# Patient Record
Sex: Male | Born: 1985 | Race: White | Hispanic: No | Marital: Single | Smoking: Current every day smoker
Health system: Southern US, Community
[De-identification: ages and names within clinical notes are randomized; demographics above are authoritative.]

## PROBLEM LIST (undated history)

## (undated) DIAGNOSIS — N200 Calculus of kidney: Secondary | ICD-10-CM

## (undated) HISTORY — PX: HAND SURGERY: SHX662

## (undated) HISTORY — PX: TONSILLECTOMY: SUR1361

---
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
## 2009-02-20 ENCOUNTER — Emergency Department (HOSPITAL_COMMUNITY): Admission: EM | Admit: 2009-02-20 | Discharge: 2009-02-20 | Payer: Self-pay | Admitting: Emergency Medicine

## 2009-12-19 ENCOUNTER — Ambulatory Visit: Payer: Self-pay | Admitting: Diagnostic Radiology

## 2009-12-19 ENCOUNTER — Emergency Department (HOSPITAL_BASED_OUTPATIENT_CLINIC_OR_DEPARTMENT_OTHER): Admission: EM | Admit: 2009-12-19 | Discharge: 2009-12-19 | Payer: Self-pay | Admitting: Emergency Medicine

## 2010-05-30 ENCOUNTER — Emergency Department (INDEPENDENT_AMBULATORY_CARE_PROVIDER_SITE_OTHER): Payer: 59

## 2010-05-30 ENCOUNTER — Emergency Department (HOSPITAL_BASED_OUTPATIENT_CLINIC_OR_DEPARTMENT_OTHER)
Admission: EM | Admit: 2010-05-30 | Discharge: 2010-05-30 | Disposition: A | Discharge status: Home / Self Care | Payer: 59 | Attending: Emergency Medicine | Admitting: Emergency Medicine

## 2010-05-30 DIAGNOSIS — R109 Unspecified abdominal pain: Secondary | ICD-10-CM | POA: Insufficient documentation

## 2010-05-30 LAB — URINALYSIS, ROUTINE W REFLEX MICROSCOPIC
Hgb urine dipstick: NEGATIVE
Ketones, ur: NEGATIVE mg/dL
Protein, ur: NEGATIVE mg/dL
Urine Glucose, Fasting: NEGATIVE mg/dL
pH: 7 (ref 5.0–8.0)

## 2010-06-16 LAB — URINALYSIS, ROUTINE W REFLEX MICROSCOPIC
Bilirubin Urine: NEGATIVE
Glucose, UA: NEGATIVE mg/dL
Nitrite: NEGATIVE
Specific Gravity, Urine: 1.014 (ref 1.005–1.030)
pH: 7.5 (ref 5.0–8.0)

## 2010-06-21 ENCOUNTER — Emergency Department (HOSPITAL_BASED_OUTPATIENT_CLINIC_OR_DEPARTMENT_OTHER)
Admission: EM | Admit: 2010-06-21 | Discharge: 2010-06-21 | Disposition: A | Discharge status: Home / Self Care | Payer: 59 | Attending: Emergency Medicine | Admitting: Emergency Medicine

## 2010-06-21 DIAGNOSIS — R197 Diarrhea, unspecified: Secondary | ICD-10-CM | POA: Insufficient documentation

## 2010-06-21 DIAGNOSIS — R109 Unspecified abdominal pain: Secondary | ICD-10-CM | POA: Insufficient documentation

## 2010-06-21 LAB — COMPREHENSIVE METABOLIC PANEL
ALT: 19 U/L (ref 0–53)
AST: 25 U/L (ref 0–37)
Alkaline Phosphatase: 87 U/L (ref 39–117)
CO2: 26 mEq/L (ref 19–32)
Chloride: 105 mEq/L (ref 96–112)
GFR calc Af Amer: >60 mL/min (ref >60)
GFR calc non Af Amer: >60 mL/min (ref >60)
Potassium: 3.8 mEq/L (ref 3.5–5.1)
Sodium: 145 mEq/L (ref 135–145)
Total Bilirubin: 0.6 mg/dL (ref 0.3–1.2)

## 2010-06-21 LAB — CBC
HCT: 42.5 % (ref 39.0–52.0)
Hemoglobin: 14.7 g/dL (ref 13.0–17.0)
RBC: 4.88 MIL/uL (ref 4.22–5.81)

## 2010-06-21 LAB — URINALYSIS, ROUTINE W REFLEX MICROSCOPIC
Hgb urine dipstick: NEGATIVE
Nitrite: NEGATIVE
Specific Gravity, Urine: 1.021 (ref 1.005–1.030)
Urobilinogen, UA: 0.2 mg/dL (ref 0.0–1.0)
pH: 7 (ref 5.0–8.0)

## 2010-06-21 LAB — DIFFERENTIAL
Basophils Absolute: 0 10*3/uL (ref 0.0–0.1)
Basophils Relative: 0 % (ref 0–1)
Lymphocytes Relative: 16 % (ref 12–46)
Neutro Abs: 6.8 10*3/uL (ref 1.7–7.7)
Neutrophils Relative %: 72 % (ref 43–77)

## 2010-07-06 LAB — URINALYSIS, ROUTINE W REFLEX MICROSCOPIC
Bilirubin Urine: NEGATIVE
Ketones, ur: NEGATIVE mg/dL
Specific Gravity, Urine: 1.019 (ref 1.005–1.030)
Urobilinogen, UA: 0.2 mg/dL (ref 0.0–1.0)

## 2010-07-06 LAB — URINE MICROSCOPIC-ADD ON

## 2010-10-03 ENCOUNTER — Emergency Department (HOSPITAL_COMMUNITY)
Admission: EM | Admit: 2010-10-03 | Discharge: 2010-10-03 | Disposition: A | Discharge status: Home / Self Care | Payer: 59 | Attending: Emergency Medicine | Admitting: Emergency Medicine

## 2010-10-03 DIAGNOSIS — R110 Nausea: Secondary | ICD-10-CM | POA: Insufficient documentation

## 2010-10-03 DIAGNOSIS — R109 Unspecified abdominal pain: Secondary | ICD-10-CM | POA: Insufficient documentation

## 2010-10-03 LAB — URINALYSIS, ROUTINE W REFLEX MICROSCOPIC
Bilirubin Urine: NEGATIVE
Leukocytes, UA: NEGATIVE
Nitrite: NEGATIVE
Specific Gravity, Urine: 1.018 (ref 1.005–1.030)
pH: 7.5 (ref 5.0–8.0)

## 2012-02-19 IMAGING — CR DG ABDOMEN 1V
1 series · 1 of 1 positions shown · non-contrast
Comparison: C t abdomen 12/19/2009.

CLINICAL DATA: Flank pain.

ABDOMEN - 1 VIEW

[t abdomen supine]
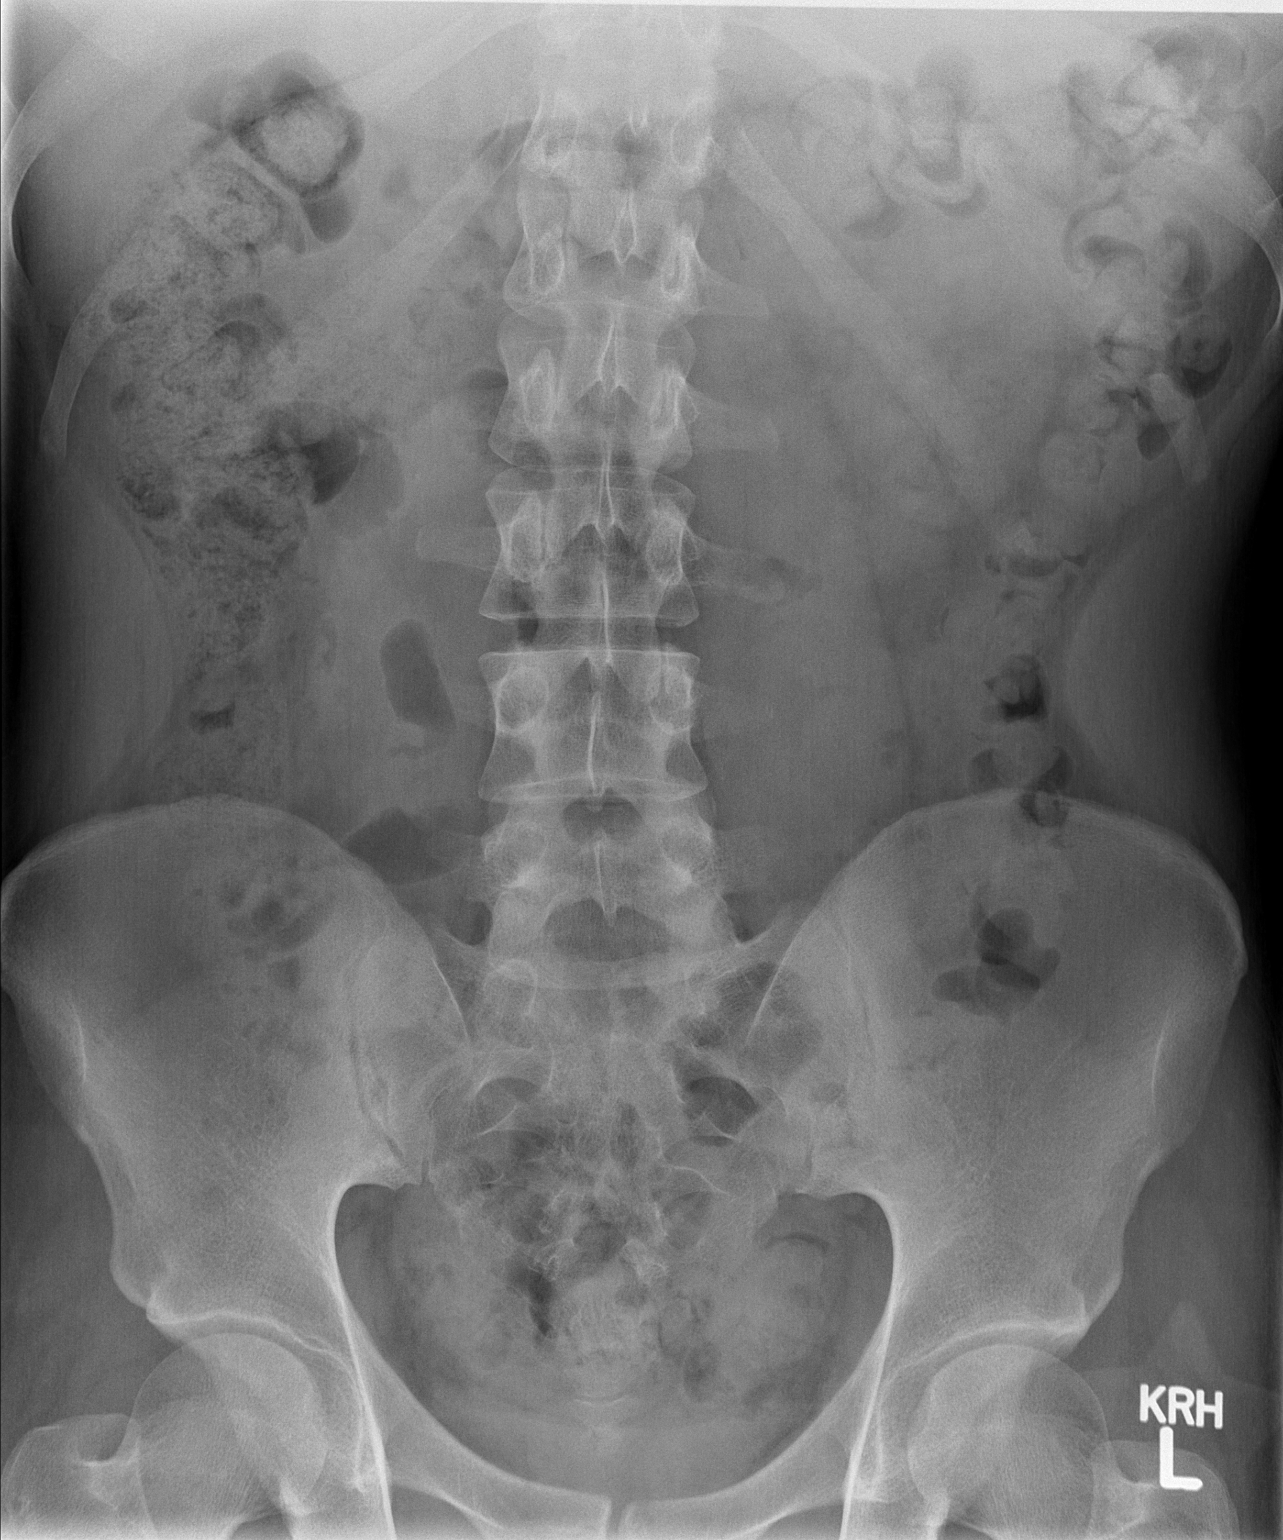

[1 of 1 positions shown; findings below may reference images not displayed]

FINDINGS: There is moderate stool throughout the colon down into
the rectum.  No distended small bowel loops to suggest obstruction.
No definite renal or ureteral calculi.  The bony structures are
unremarkable.
IMPRESSION: 1.  Moderate stool throughout the colon.
2.  No definite renal or ureteral calculi.

## 2020-09-08 ENCOUNTER — Other Ambulatory Visit: Payer: Self-pay

## 2020-09-08 ENCOUNTER — Emergency Department (HOSPITAL_BASED_OUTPATIENT_CLINIC_OR_DEPARTMENT_OTHER)
Admission: EM | Admit: 2020-09-08 | Discharge: 2020-09-08 | Disposition: A | Discharge status: Home / Self Care | Payer: Self-pay | Attending: Emergency Medicine | Admitting: Emergency Medicine

## 2020-09-08 ENCOUNTER — Encounter (HOSPITAL_BASED_OUTPATIENT_CLINIC_OR_DEPARTMENT_OTHER): Payer: Self-pay

## 2020-09-08 DIAGNOSIS — R109 Unspecified abdominal pain: Secondary | ICD-10-CM | POA: Insufficient documentation

## 2020-09-08 DIAGNOSIS — F17290 Nicotine dependence, other tobacco product, uncomplicated: Secondary | ICD-10-CM | POA: Insufficient documentation

## 2020-09-08 DIAGNOSIS — R10A2 Flank pain, left side: Secondary | ICD-10-CM

## 2020-09-08 HISTORY — DX: Calculus of kidney: N20.0

## 2020-09-08 LAB — URINALYSIS, ROUTINE W REFLEX MICROSCOPIC
Bilirubin Urine: NEGATIVE
Glucose, UA: NEGATIVE mg/dL
Hgb urine dipstick: NEGATIVE
Ketones, ur: NEGATIVE mg/dL
Leukocytes,Ua: NEGATIVE
Nitrite: NEGATIVE
Protein, ur: NEGATIVE mg/dL
Specific Gravity, Urine: 1.02 (ref 1.005–1.030)
pH: 6.5 (ref 5.0–8.0)

## 2020-09-08 MED ORDER — KETOROLAC TROMETHAMINE 60 MG/2ML IM SOLN
15.0000 mg | Freq: Once | INTRAMUSCULAR | Status: AC
  Administered 2020-09-08: 15 mg via INTRAMUSCULAR
  Filled 2020-09-08: qty 2

## 2020-09-08 NOTE — ED Provider Notes (Signed)
[facility] EMERGENCY DEPARTMENT Provider Note   CSN: [id number] Arrival date & time: 09/08/20  1650     History Chief Complaint  Patient presents with  . Flank Pain    Jeremy Whitaker is a 35 y.o. male.  35 yo M with a chief complaints of left flank pain that radiates to the testicle.  Has been going on for 1 day.  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  Started having the pain then today.  Worse with movement twisting ambulation.  He has had kidney stones in the past and does not think this feels similar.  Denies dysuria gross frequency or hesitancy.  He was concerned because of the testicular pain, does not normally get that with his kidney stones and so came here to be evaluated.  Pain is worse on the left than the right.  No bulges or masses.  The history is provided by the patient.  Flank Pain This is a new problem. The current episode started 6 to 12 hours ago. The problem occurs constantly. The problem has not changed since onset.Pertinent negatives include no chest pain, no abdominal pain, no headaches and no shortness of breath. The symptoms are aggravated by twisting, walking and bending. He has tried nothing for the symptoms. The treatment provided no relief.       Past Medical History:  Diagnosis Date  . Kidney stone     There are no problems to display for this patient.   Past Surgical History:  Procedure Laterality Date  . HAND SURGERY    . TONSILLECTOMY         No family history on file.  Social History   Tobacco Use  . Smoking status: Current Every Day Smoker    Types: E-cigarettes  . Smokeless tobacco: Never Used  Vaping Use  . Vaping Use: Every day  Substance Use Topics  . Alcohol use: Yes    Comment: occ  . Drug use: Never    Home Medications Prior to Admission medications   Not on File    Allergies    Codeine and Penicillins  Review of Systems   Review of Systems  Constitutional:  Negative for chills and fever.  HENT: Negative for congestion and facial swelling.   Eyes: Negative for discharge and visual disturbance.  Respiratory: Negative for shortness of breath.   Cardiovascular: Negative for chest pain and palpitations.  Gastrointestinal: Negative for abdominal pain, diarrhea and vomiting.  Genitourinary: Positive for flank pain and testicular pain.  Musculoskeletal: Negative for arthralgias and myalgias.  Skin: Negative for color change and rash.  Neurological: Negative for tremors, syncope and headaches.  Psychiatric/Behavioral: Negative for confusion and dysphoric mood.    Physical Exam Updated Vital Signs BP 135/90 (BP Location: Left Arm)   Pulse 68   Temp 99 F (37.2 C) (Oral)   Resp 16   Ht 5\' 11"  (1.803 m)   Wt 98 kg   SpO2 100%   BMI 30.13 kg/m   Physical Exam Vitals and nursing note reviewed.  Constitutional:      Appearance: He is well-developed.  HENT:     Head: Normocephalic and atraumatic.  Eyes:     Pupils: Pupils are equal, round, and reactive to light.  Neck:     Vascular: No JVD.  Cardiovascular:     Rate and Rhythm: Normal rate and regular rhythm.     Heart sounds: No murmur heard. No friction rub. No  gallop.   Pulmonary:     Effort: No respiratory distress.     Breath sounds: No wheezing.  Abdominal:     General: There is no distension.     Tenderness: There is no abdominal tenderness. There is no guarding or rebound.     Hernia: No hernia is present.  Genitourinary:    Comments: Circumcised, cremasteric reflex intact bilaterally.  No obvious testicular pain on exam.  No mass in the inguinal canal. Musculoskeletal:        General: Normal range of motion.     Cervical back: Normal range of motion and neck supple.  Skin:    Coloration: Skin is not pale.     Findings: No rash.  Neurological:     Mental Status: He is alert and oriented to person, place, and time.  Psychiatric:        Behavior: Behavior normal.      ED Results / Procedures / Treatments   Labs (all labs ordered are listed, but only abnormal results are displayed) Labs Reviewed  URINALYSIS, ROUTINE W REFLEX MICROSCOPIC    EKG None  Radiology No results found.  Procedures Procedures   Medications Ordered in ED Medications  ketorolac (TORADOL) injection 15 mg (15 mg Intramuscular Given 09/08/20 1716)    ED Course  I have reviewed the triage vital signs and the nursing notes.  Pertinent labs & imaging results that were available during my care of the patient were reviewed by me and considered in my medical decision making (see chart for details).    MDM Rules/Calculators/A&P                          35 yo M with a chief complaints of left sided back pain that radiates down into the groin.  Going on just today.  Occurred after he lifted something heavy yesterday.  Worse with different positions.  Patient was concerned because he had pain that radiated into the testicle.  He has no testicular pain on exam.  Cremasteric reflex is intact.  There is no masses in the canal.  I suspect the patient likely has a muscular strain.  We will test his UA as he has had multiple kidney stones in the past.  If positive for hematuria we will have him follow-up with urology.   UA without infection or hematuria.  Will treat as a muscular strain.  PCP follow-up.  5:28 PM:  I have discussed the diagnosis/risks/treatment options with the patient and believe the pt to be eligible for discharge home to follow-up with PCP. We also discussed returning to the ED immediately if new or worsening sx occur. We discussed the sx which are most concerning (e.g., sudden worsening pain, fever, inability to tolerate by mouth) that necessitate immediate return. Medications administered to the patient during their visit and any new prescriptions provided to the patient are listed below.  Medications given during this visit Medications  ketorolac (TORADOL)  injection 15 mg (15 mg Intramuscular Given 09/08/20 1716)     The patient appears reasonably screen and/or stabilized for discharge and I doubt any other medical condition or other [facility] requiring further screening, evaluation, or treatment in the ED at this time prior to discharge.    Final Clinical Impression(s) / ED Diagnoses Final diagnoses:  Left flank pain    Rx / DC Orders ED Discharge Orders    None       Melene Plan, DO 09/08/20  1728  

## 2020-09-08 NOTE — Discharge Instructions (Signed)
Your urine did not have any blood in it.  Based on the history the most likely diagnosis is a back strain. Please return for worsening testicular pain, pain to the testicle with touch, nausea, vomiting, sudden worsening pain.   Your back pain is most likely due to a muscular strain.  There is been a lot of research on back pain, unfortunately the only thing that seems to really help is Tylenol and ibuprofen.  Relative rest is also important to not lift greater than 10 pounds bending or twisting at the waist.  Please follow-up with your family physician.  The other thing that really seems to benefit patients is physical therapy which your doctor may send you for.  Please return to the emergency department for new numbness or weakness to your arms or legs. Difficulty with urinating or urinating or pooping on yourself.  Also if you cannot feel toilet paper when you wipe or get a fever.   Take 4 over the counter ibuprofen tablets 3 times a day or 2 over-the-counter naproxen tablets twice a day for pain. Also take tylenol 1000mg (2 extra strength) four times a day.

## 2020-09-08 NOTE — ED Triage Notes (Addendum)
Pt c/o left flank, left abd/groin and left testicle pain x today-NAD-steady gait

## 2021-03-30 ENCOUNTER — Emergency Department (HOSPITAL_BASED_OUTPATIENT_CLINIC_OR_DEPARTMENT_OTHER)
Admission: EM | Admit: 2021-03-30 | Discharge: 2021-03-31 | Disposition: A | Discharge status: Home / Self Care | Payer: Self-pay | Attending: Emergency Medicine | Admitting: Emergency Medicine

## 2021-03-30 ENCOUNTER — Encounter (HOSPITAL_BASED_OUTPATIENT_CLINIC_OR_DEPARTMENT_OTHER): Payer: Self-pay | Admitting: *Deleted

## 2021-03-30 ENCOUNTER — Other Ambulatory Visit: Payer: Self-pay

## 2021-03-30 DIAGNOSIS — R1030 Lower abdominal pain, unspecified: Secondary | ICD-10-CM | POA: Insufficient documentation

## 2021-03-30 DIAGNOSIS — R112 Nausea with vomiting, unspecified: Secondary | ICD-10-CM | POA: Insufficient documentation

## 2021-03-30 DIAGNOSIS — R197 Diarrhea, unspecified: Secondary | ICD-10-CM | POA: Insufficient documentation

## 2021-03-30 DIAGNOSIS — F17290 Nicotine dependence, other tobacco product, uncomplicated: Secondary | ICD-10-CM | POA: Insufficient documentation

## 2021-03-30 LAB — COMPREHENSIVE METABOLIC PANEL
ALT: 43 U/L (ref 0–44)
AST: 56 U/L — ABNORMAL HIGH (ref 15–41)
Albumin: 4.5 g/dL (ref 3.5–5.0)
Alkaline Phosphatase: 93 U/L (ref 38–126)
Anion gap: 9 (ref 5–15)
BUN: 13 mg/dL (ref 6–20)
CO2: 23 mmol/L (ref 22–32)
Calcium: 8.7 mg/dL — ABNORMAL LOW (ref 8.9–10.3)
Chloride: 105 mmol/L (ref 98–111)
Creatinine, Ser: 1.09 mg/dL (ref 0.61–1.24)
GFR, Estimated: >60 mL/min (ref >60)
Glucose, Bld: 89 mg/dL (ref 70–99)
Potassium: 3.7 mmol/L (ref 3.5–5.1)
Sodium: 137 mmol/L (ref 135–145)
Total Bilirubin: 0.7 mg/dL (ref 0.3–1.2)
Total Protein: 7.4 g/dL (ref 6.5–8.1)

## 2021-03-30 LAB — URINALYSIS, ROUTINE W REFLEX MICROSCOPIC
Bilirubin Urine: NEGATIVE
Glucose, UA: NEGATIVE mg/dL
Ketones, ur: NEGATIVE mg/dL
Leukocytes,Ua: NEGATIVE
Nitrite: NEGATIVE
Protein, ur: NEGATIVE mg/dL
Specific Gravity, Urine: >=1.03 (ref 1.005–1.030)
pH: 6 (ref 5.0–8.0)

## 2021-03-30 LAB — CBC
HCT: 46.5 % (ref 39.0–52.0)
Hemoglobin: 16.2 g/dL (ref 13.0–17.0)
MCH: 29.7 pg (ref 26.0–34.0)
MCHC: 34.8 g/dL (ref 30.0–36.0)
MCV: 85.3 fL (ref 80.0–100.0)
Platelets: 248 10*3/uL (ref 150–400)
RBC: 5.45 MIL/uL (ref 4.22–5.81)
RDW: 12.8 % (ref 11.5–15.5)
WBC: 11 10*3/uL — ABNORMAL HIGH (ref 4.0–10.5)
nRBC: 0 % (ref 0.0–0.2)

## 2021-03-30 LAB — URINALYSIS, MICROSCOPIC (REFLEX)

## 2021-03-30 LAB — LIPASE, BLOOD: Lipase: 30 U/L (ref 11–51)

## 2021-03-30 NOTE — ED Triage Notes (Signed)
C/o right lower abd apin with n/v/d x 1 day

## 2021-03-31 MED ORDER — ONDANSETRON 4 MG PO TBDP: 4.0000 mg | ORAL_TABLET | Freq: Three times a day (TID) | ORAL | 0 refills | Status: AC | PRN

## 2021-03-31 NOTE — Discharge Instructions (Signed)
You were evaluated in the Emergency Department and after careful evaluation, we did not find any emergent condition requiring admission or further testing in the hospital.  Your exam/testing today is overall reassuring.  Symptoms most likely due to a viral gastroenteritis.  Recommend using Zofran as needed for nausea.  Drinking plenty of fluids.  Can use over-the-counter Imodium for diarrhea as needed.  We discussed the less likely possibility of appendicitis.  Importantly return to the emergency department with any worsening of condition, specifically more specific pain to the right lower abdomen with any fever.  Thank you for allowing [place] to be a part of your care.

## 2021-03-31 NOTE — ED Notes (Signed)
D/c paperwork reviewed with pt, including prescription. Pt with no questions or concerns at time of d/c. Ambulatory to ED exit without assistance.  

## 2021-03-31 NOTE — ED Provider Notes (Signed)
[facility] Emergency Department Provider Note MRN:  [id number]  Arrival date & time: 03/31/21     Chief Complaint   Abdominal Pain   History of Present Illness   Jeremy Whitaker is a 35 y.o. year-old male with no pertinent past medical history presenting to the ED with chief complaint of abdominal pain.  Nausea vomiting and diarrhea for the past 1 or 2 days.  Associated with some lower abdominal cramping bilaterally.  Pain is mild.  Denies fever.  Appetite is a bit decreased.  No chest pain, no shortness of breath, no sick contacts.  Review of Systems  A complete 10 system review of systems was obtained and all systems are negative except as noted in the HPI and PMH.   Patient's Health History    Past Medical History:  Diagnosis Date   Kidney stone     Past Surgical History:  Procedure Laterality Date   HAND SURGERY     TONSILLECTOMY      No family history on file.  Social History   Socioeconomic History   Marital status: Single    Spouse name: Not on file   Number of children: Not on file   Years of education: Not on file   Highest education level: Not on file  Occupational History   Not on file  Tobacco Use   Smoking status: Every Day    Types: E-cigarettes   Smokeless tobacco: Never  Vaping Use   Vaping Use: Every day  Substance and Sexual Activity   Alcohol use: Yes    Comment: occ   Drug use: Never   Sexual activity: Not on file  Other Topics Concern   Not on file  Social History Narrative   Not on file   Social Determinants of Health   Financial Resource Strain: Not on file  Food Insecurity: Not on file  Transportation Needs: Not on file  Physical Activity: Not on file  Stress: Not on file  Social Connections: Not on file  Intimate Partner Violence: Not on file     Physical Exam   Vitals:   03/30/21 2326 03/30/21 2330  BP: 123/83 (!) 127/94  Pulse: 73 74  Resp: 18 18  Temp:    SpO2: 95% 99%     CONSTITUTIONAL: Well-appearing, NAD NEURO:  Alert and oriented x 3, no focal deficits EYES:  eyes equal and reactive ENT/NECK:  no LAD, no JVD CARDIO: Regular rate, well-perfused, normal S1 and S2 PULM:  CTAB no wheezing or rhonchi GI/GU:  normal bowel sounds, non-distended, non-tender MSK/SPINE:  No gross deformities, no edema SKIN:  no rash, atraumatic PSYCH:  Appropriate speech and behavior  *Additional and/or pertinent findings included in MDM below  Diagnostic and Interventional Summary    EKG Interpretation  Date/Time:    Ventricular Rate:    PR Interval:    QRS Duration:   QT Interval:    QTC Calculation:   R Axis:     Text Interpretation:         Labs Reviewed  COMPREHENSIVE METABOLIC PANEL - Abnormal; Notable for the following components:      Result Value   Calcium 8.7 (*)    AST 56 (*)    All other components within normal limits  CBC - Abnormal; Notable for the following components:   WBC 11.0 (*)    All other components within normal limits  URINALYSIS, ROUTINE W REFLEX MICROSCOPIC - Abnormal; Notable for the following components:  Hgb urine dipstick MODERATE (*)    All other components within normal limits  URINALYSIS, MICROSCOPIC (REFLEX) - Abnormal; Notable for the following components:   Bacteria, UA RARE (*)    All other components within normal limits  LIPASE, BLOOD    No orders to display    Medications - No data to display   Procedures  /  Critical Care Procedures  ED Course and Medical Decision Making  I have reviewed the triage vital signs, the nursing notes, and pertinent available records from the EMR.  Listed above are laboratory and imaging tests that I personally ordered, reviewed, and interpreted and then considered in my medical decision making (see below for details).  Nausea vomiting and diarrhea with some lower abdominal cramping, well-appearing, normal vital signs.  Favoring a benign viral gastroenteritis.  Abdomen is  completely soft and nontender with no rebound guarding or rigidity.  Given the lower abdominal pain there is the consideration of appendicitis.  There is no McBurney's point tenderness, concern is very low at this point.  Shared decision making utilized with patient, option of CT scan this evening discussed.  He agrees to try symptomatic management at home and will return if symptoms worsen.       Elmer Sow. Pilar Plate, MD [facility] Emergency Medicine [facility] [email or web address]  Final Clinical Impressions(s) / ED Diagnoses     ICD-10-CM   1. Nausea vomiting and diarrhea  R11.2    R19.7     2. Lower abdominal pain  R10.30       ED Discharge Orders          Ordered    ondansetron (ZOFRAN-ODT) 4 MG disintegrating tablet  Every 8 hours PRN        03/31/21 0027             Discharge Instructions Discussed with and Provided to Patient:    Discharge Instructions      You were evaluated in the Emergency Department and after careful evaluation, we did not find any emergent condition requiring admission or further testing in the hospital.  Your exam/testing today is overall reassuring.  Symptoms most likely due to a viral gastroenteritis.  Recommend using Zofran as needed for nausea.  Drinking plenty of fluids.  Can use over-the-counter Imodium for diarrhea as needed.  We discussed the less likely possibility of appendicitis.  Importantly return to the emergency department with any worsening of condition, specifically more specific pain to the right lower abdomen with any fever.  Thank you for allowing [place] to be a part of your care.       Sabas Sous, MD 03/31/21 [place]

## 2022-07-30 ENCOUNTER — Emergency Department (HOSPITAL_BASED_OUTPATIENT_CLINIC_OR_DEPARTMENT_OTHER)
Admission: EM | Admit: 2022-07-30 | Discharge: 2022-07-30 | Disposition: A | Discharge status: Home / Self Care | Payer: BLUE CROSS/BLUE SHIELD | Attending: Emergency Medicine | Admitting: Emergency Medicine

## 2022-07-30 ENCOUNTER — Encounter (HOSPITAL_BASED_OUTPATIENT_CLINIC_OR_DEPARTMENT_OTHER): Payer: Self-pay | Admitting: Emergency Medicine

## 2022-07-30 ENCOUNTER — Other Ambulatory Visit: Payer: Self-pay

## 2022-07-30 ENCOUNTER — Emergency Department (HOSPITAL_BASED_OUTPATIENT_CLINIC_OR_DEPARTMENT_OTHER): Payer: BLUE CROSS/BLUE SHIELD

## 2022-07-30 DIAGNOSIS — R109 Unspecified abdominal pain: Secondary | ICD-10-CM | POA: Diagnosis present

## 2022-07-30 DIAGNOSIS — N200 Calculus of kidney: Secondary | ICD-10-CM | POA: Insufficient documentation

## 2022-07-30 LAB — URINALYSIS, ROUTINE W REFLEX MICROSCOPIC
Bilirubin Urine: NEGATIVE
Glucose, UA: NEGATIVE mg/dL
Hgb urine dipstick: NEGATIVE
Ketones, ur: NEGATIVE mg/dL
Leukocytes,Ua: NEGATIVE
Nitrite: NEGATIVE
Protein, ur: NEGATIVE mg/dL
Specific Gravity, Urine: 1.02 (ref 1.005–1.030)
pH: 7 (ref 5.0–8.0)

## 2022-07-30 NOTE — ED Triage Notes (Signed)
Right lower back pain that radiates around to RLQ of abdomen. Pain is intermittent, sharp. Reports hx of kidney stones. Pain was sudden onset this morning.

## 2022-07-30 NOTE — Discharge Instructions (Signed)
You are seen today for right-sided flank pain.  Your objective evaluation is fortunately reassuring at this time.  We did identify a right-sided renal cystic structure that should be followed by your primary care provider.  You likely need an outpatient ultrasound for further diagnostic evaluation but there is no acute indication for further emergent intervention in the ER today.

## 2022-07-30 NOTE — ED Provider Notes (Signed)
[place] EMERGENCY DEPARTMENT AT [facility] Provider Note   CSN: [id number] Arrival date & time: 07/30/22  1602     History Chief Complaint  Patient presents with   Back Pain    HPI Ceylon Arenson is a 37 y.o. male presenting for right-sided flank pain that occurred early this morning.  It lasted proxy 1 hour before resolving.  States has a history of nephrolithiasis that has presented prior.  He is currently asymptomatic and has been for the past few hours.  Denies fevers chills nausea vomiting syncope shortness of breath.  Otherwise ambulatory tolerating p.o. intake at this time.   Patient's recorded medical, surgical, social, medication list and allergies were reviewed in the Snapshot window as part of the initial history.   Review of Systems   Review of Systems  Constitutional:  Negative for chills and fever.  HENT:  Negative for ear pain and sore throat.   Eyes:  Negative for pain and visual disturbance.  Respiratory:  Negative for cough and shortness of breath.   Cardiovascular:  Negative for chest pain and palpitations.  Gastrointestinal:  Negative for abdominal pain and vomiting.  Genitourinary:  Positive for flank pain. Negative for dysuria and hematuria.  Musculoskeletal:  Negative for arthralgias and back pain.  Skin:  Negative for color change and rash.  Neurological:  Negative for seizures and syncope.  All other systems reviewed and are negative.   Physical Exam Updated Vital Signs BP 129/77 (BP Location: Right Arm)   Pulse 83   Temp 97.8 F (36.6 C)   Resp 18   Ht 5\' 11"  (1.803 m)   Wt 79.4 kg   SpO2 100%   BMI 24.41 kg/m  Physical Exam Vitals and nursing note reviewed.  Constitutional:      General: He is not in acute distress.    Appearance: He is well-developed.  HENT:     Head: Normocephalic and atraumatic.  Eyes:     Conjunctiva/sclera: Conjunctivae normal.  Cardiovascular:     Rate and Rhythm: Normal rate and regular rhythm.      Heart sounds: No murmur heard. Pulmonary:     Effort: Pulmonary effort is normal. No respiratory distress.     Breath sounds: Normal breath sounds.  Abdominal:     Palpations: Abdomen is soft.     Tenderness: There is no abdominal tenderness. There is no right CVA tenderness or left CVA tenderness.  Musculoskeletal:        General: No swelling.     Cervical back: Neck supple.  Skin:    General: Skin is warm and dry.     Capillary Refill: Capillary refill takes less than 2 seconds.  Neurological:     Mental Status: He is alert.  Psychiatric:        Mood and Affect: Mood normal.      ED Course/ Medical Decision Making/ A&P    Procedures Procedures   Medications Ordered in ED Medications - No data to display Medical Decision Making:   Buford Bremer is a 37 y.o. male who presented to the ED today with right sided flank pain, detailed above.    Complete initial physical exam performed, notably the patient  was HDS in NAD.  Now asymptomatic     Reviewed and confirmed nursing documentation for past medical history, family history, social history.    Initial Assessment:   With the patient's presentation of flank pain, most likely diagnosis is Urologic pathology (including UL vs UTI vs  pyelo) vs MSK etiology. Other diagnoses were considered including (but not limited to) gastroenteritis, colitis, small bowel obstruction, appendicitis, cholecystitis, pancreatitis,  testicular torsion. These are considered less likely due to history of present illness and physical exam findings.   This is most consistent with an acute life/limb threatening illness complicated by underlying chronic conditions.   Initial Plan:  CT Ab/pelvis without contrast due to favored urologic over GI etiology for patient's abdominal pain  Urinalysis and repeat physical assessment to evaluate for UTI/Pyelonpehritis  Empiric management of symptoms with escalating pain control and antiemetics as needed.    Initial Study Results:   Laboratory  All laboratory results reviewed without evidence of clinically relevant pathology.   Radiology All images reviewed independently. Agree with radiology report at this time.   CT Renal Stone Study  Result Date: 07/30/2022 CLINICAL DATA:  Abdominal/flank pain, stone suspected EXAM: CT ABDOMEN AND PELVIS WITHOUT CONTRAST TECHNIQUE: Multidetector CT imaging of the abdomen and pelvis was performed following the standard protocol without IV contrast. RADIATION DOSE REDUCTION: This exam was performed according to the departmental dose-optimization program which includes automated exposure control, adjustment of the mA and/or kV according to patient size and/or use of iterative reconstruction technique. COMPARISON:  04/26/2018 and previous FINDINGS: Lower chest: Visualized segments of lung bases clear. Hepatobiliary: Incompletely visualized, with no acute finding. Pancreas: Unremarkable, portions of tail incompletely visualized. Spleen: Incompletely visualized, upper limits normal in size. Adrenals/Urinary Tract: Adrenal glands unremarkable, incompletely visualized. Multiple low-attenuation renal lesions up to 2.7 cm, some of which were evident on the prior study, statistically most likely cysts but incompletely characterized. A 2.6 cm 12 HU lesion in the right upper pole is poorly marginated, significantly increased since 0.8 cm process evident on the most recent prior exam. There is bilateral urolithiasis with scattered small stones up to 3 mm. No hydronephrosis or ureteral calculus. Urinary bladder is nondistended. Stomach/Bowel: Stomach incompletely distended, visualized portions unremarkable. Small bowel decompressed. Appendix not localized. The colon is partially distended by gas and fecal material, unremarkable. Vascular/Lymphatic: Left-sided infrarenal IVC, an anatomic variant. No abdominal or pelvic adenopathy. Reproductive: Prostate is unremarkable. Other: Left pelvic  phlebolith.  No ascites.  No free air. Musculoskeletal: No acute or significant osseous findings. IMPRESSION: 1. Bilateral nephrolithiasis without hydronephrosis or ureteral calculus. 2. Interval increase in size of poorly marginated 2.6 cm right upper pole renal lesion, probably cyst but incompletely characterized on this noncontrast study. Recommend elective outpatient renal ultrasound for further evaluation. Electronically Signed   By: Corlis Leak M.D.   On: 07/30/2022 16:41    Final Reassessment and Plan:   Reassessed at bedside.  Given resolution of symptoms during his hour and a half stay in the emergency room and negative objective evaluation per triage, there is no acute indication for further intervention at this time.  Patient was comfortable outpatient care management.  I discussed clinical overlap of right lower quadrant pain with similar syndrome such as appendicitis, cholecystitis and that patient may need to return if he is symptomatic worsening but given that he is asymptomatic this morning he may have passed a kidney stone or be suffering from renal colic as the differential.  Recommended NSAID therapy as needed and follow-up with his primary care provider or returning if he has more symptomatic changes.  Disposition:  I have considered need for hospitalization, however, considering all of the above, I believe this patient is stable for discharge at this time.  Patient/family educated about specific return precautions for given chief  complaint and symptoms.  Patient/family educated about follow-up with PCP .    Patient informed about his right-sided cystic structure on his kidney that needs to follow-up with his PCP for outpatient ultrasound  Patient/family expressed understanding of return precautions and need for follow-up. Patient spoken to regarding all imaging and laboratory results and appropriate follow up for these results. All education provided in verbal form with additional  information in written form. Time was allowed for answering of patient questions. Patient discharged.    Emergency Department Medication Summary:   Medications - No data to display     Clinical Impression:  1. Flank pain      Discharge   Final Clinical Impression(s) / ED Diagnoses Final diagnoses:  Flank pain    Rx / DC Orders ED Discharge Orders     None         Glyn Ade, MD 07/30/22 1726

## 2023-10-25 ENCOUNTER — Other Ambulatory Visit: Payer: Self-pay

## 2023-10-25 ENCOUNTER — Encounter (HOSPITAL_BASED_OUTPATIENT_CLINIC_OR_DEPARTMENT_OTHER): Payer: Self-pay

## 2023-10-25 ENCOUNTER — Emergency Department (HOSPITAL_BASED_OUTPATIENT_CLINIC_OR_DEPARTMENT_OTHER)
Admission: EM | Admit: 2023-10-25 | Discharge: 2023-10-25 | Disposition: A | Discharge status: Home / Self Care | Attending: Emergency Medicine | Admitting: Emergency Medicine

## 2023-10-25 DIAGNOSIS — M545 Low back pain, unspecified: Secondary | ICD-10-CM | POA: Diagnosis present

## 2023-10-25 MED ORDER — PREDNISONE 10 MG PO TABS: ORAL_TABLET | ORAL | 0 refills | Status: AC

## 2023-10-25 MED ORDER — CYCLOBENZAPRINE HCL 10 MG PO TABS: 5.0000 mg | ORAL_TABLET | Freq: Every day | ORAL | 0 refills | Status: AC

## 2023-10-25 MED ORDER — LIDOCAINE 5 % EX PTCH: 1.0000 | MEDICATED_PATCH | CUTANEOUS | 0 refills | Status: AC

## 2023-10-25 MED ORDER — PREDNISONE 20 MG PO TABS
40.0000 mg | ORAL_TABLET | Freq: Once | ORAL | Status: AC
  Administered 2023-10-25: 40 mg via ORAL
  Filled 2023-10-25: qty 2

## 2023-10-25 MED ORDER — LIDOCAINE 5 % EX PTCH
1.0000 | MEDICATED_PATCH | CUTANEOUS | Status: DC
  Administered 2023-10-25: 1 via TRANSDERMAL
  Filled 2023-10-25: qty 1

## 2023-10-25 NOTE — ED Triage Notes (Signed)
 Pt reports lower back pain X 3 days. States he was in the gym on Tuesday and heard something pop. Denies any other symptoms.

## 2023-10-25 NOTE — ED Provider Notes (Signed)
 [place] [facility] Provider Note   CSN: [id number] Arrival date & time: 10/25/23  1228     Patient presents with: Back Pain   Jeremy Whitaker is a 38 y.o. male with history of nephrolithiasis, presents with concern for acute lower back pain that started at the gym 3 days ago.  States he was squatting when he heard something pop and had immediate pain. Reports some tingling sensation that goes down into the gluteal region, but no shooting pains or numbness into the legs.  Denies any bowel or bladder incontinence, urinary retention, fevers, any history of IV drug use or malignancy.  Reports pain worse particularly when bending over and trying to pick up his toddlers.  He has been trying Tylenol and ibuprofen at home without significant relief of pain.    Back Pain      Prior to Admission medications   Medication Sig Start Date End Date Taking? Authorizing Provider  cyclobenzaprine  (FLEXERIL ) 10 MG tablet Take 0.5-1 tablets (5-10 mg total) by mouth at bedtime for 7 days. 10/25/23 11/01/23 Yes Veta Palma, PA-C  lidocaine  (LIDODERM ) 5 % Place 1 patch onto the skin daily. Apply 1 patch for up to 12 hours.  Then remove the patch for full 12 hours before reapplying a new patch 10/25/23  Yes Veta Palma, PA-C  predniSONE  (DELTASONE ) 10 MG tablet Take 4 tablets (40 mg total) by mouth daily with breakfast for 1 day, THEN 3 tablets (30 mg total) daily with breakfast for 2 days, THEN 2 tablets (20 mg total) daily with breakfast for 2 days, THEN 1 tablet (10 mg total) daily with breakfast for 1 day. 10/26/23 11/01/23 Yes Veta Palma, PA-C  ondansetron  (ZOFRAN -ODT) 4 MG disintegrating tablet Take 1 tablet (4 mg total) by mouth every 8 (eight) hours as needed for nausea or vomiting. 03/31/21   Theadore Ozell HERO, MD    Allergies: Codeine and Penicillins    Review of Systems  Musculoskeletal:  Positive for back pain.    Updated Vital Signs BP 125/71  (BP Location: Left Arm)   Pulse 76   Temp 97.8 F (36.6 C) (Oral)   Resp 16   SpO2 98%   Physical Exam Vitals and nursing note reviewed.  Constitutional:      General: He is not in acute distress.    Appearance: He is well-developed.  HENT:     Head: Normocephalic and atraumatic.  Eyes:     Conjunctiva/sclera: Conjunctivae normal.  Cardiovascular:     Rate and Rhythm: Normal rate and regular rhythm.     Heart sounds: No murmur heard.    Comments: 2+ pedal pulses bilaterally Pulmonary:     Effort: Pulmonary effort is normal. No respiratory distress.     Breath sounds: Normal breath sounds.  Abdominal:     Palpations: Abdomen is soft.     Tenderness: There is no abdominal tenderness.  Musculoskeletal:        General: No swelling.     Cervical back: Neck supple.     Comments: No cervical, thoracic, or lumbar spinal tenderness to palpation.  Tender to palpation of the paraspinal musculature of the lumbar spine.   Able to move all extremities without difficulty.  Able to ambulate.  Skin:    General: Skin is warm and dry.     Capillary Refill: Capillary refill takes less than 2 seconds.  Neurological:     Mental Status: He is alert.     Comments: 5/5  strength with resisted knee flexion bilaterally, knee flexion extension bilaterally, ankle plantarflexion and dorsiflexion bilaterally  Intact sensation to the bilateral lower extremities  Psychiatric:        Mood and Affect: Mood normal.     (all labs ordered are listed, but only abnormal results are displayed) Labs Reviewed - No data to display  EKG: None  Radiology: No results found.   Procedures   Medications Ordered in the ED  lidocaine  (LIDODERM ) 5 % 1 patch (has no administration in time range)  predniSONE  (DELTASONE ) tablet 40 mg (has no administration in time range)                                    Medical Decision Making    Differential diagnosis includes but is not limited to Musculoskeletal  pain, radiculopathy, spinal stenosis, herniated nucleus pulposis, fracture, cauda equina, epidural abscess, shingles, nephrolithiasis    ED Course:  Upon initial evaluation, patient is well-appearing, no acute distress.  Stable vitals.  Reporting pain to the mid lower back.  He does not have any point tenderness of the cervical, thoracic, lumbar spine. Does have tenderness to be lumbar paraspinal musculature.  He is neurovascularly intact in the bilateral lower extremities.  Does not have any shooting pains down the legs, lower concern for herniated nucleus pulposus or sciatica.  No red flags such as bowel or bladder incontinence, fever, saddle anesthesia.  No indication for imaging at this time.  Exam and history consistent with muscle strain.  Stable and appropriate for discharge home at this time  Medications Given: Prednisone  Lidocaine  patch  Impression: Musculoskeletal lower back pain  Disposition:  The patient was discharged home with instructions to take prednisone  taper as prescribed, I confirmed that patient is not a diabetic.  May continue to take Tylenol and ibuprofen as needed for pain.  May take Flexeril  before bed as needed for back pain.  He understands Flexeril  may make him drowsy and did not drink alcohol or drive after taking this medication.  May use heating packs on low back to help with pain.  May use the lidocaine  patches prescribed help with pain.  Follow-up with PCP if symptoms not improving within the next 2 weeks Return precautions given.   This chart was dictated using voice recognition software, Dragon. Despite the best efforts of this provider to proofread and correct errors, errors may still occur which can change documentation meaning.       Final diagnoses:  Acute midline low back pain without sciatica    ED Discharge Orders          Ordered    predniSONE  (DELTASONE ) 10 MG tablet  Q breakfast        10/25/23 1304    lidocaine  (LIDODERM ) 5 %  Every 24  hours        10/25/23 1304    cyclobenzaprine  (FLEXERIL ) 10 MG tablet  Daily at bedtime        10/25/23 1304               Veta Palma, PA-C 10/25/23 1308    Randol Simmonds, MD 10/26/23 [phone]

## 2023-10-25 NOTE — Discharge Instructions (Addendum)
 Please engage in light physical activity (like walking) to prevent your back pain from worsening and to prevent stiffness. Refrain from bedrest which can make your pain worse.   You may use up to 600mg  ibuprofen every 6 hours as needed for pain.  Do not exceed 2.4g of ibuprofen per day.  You may take up to 1000mg  of tylenol every 6 hours as needed for pain.  Do not take more then 4g per day.    You may use a heating back on your back to help with the pain.  You have been prescribed a muscle relaxer called Flexeril  (cyclobenzaprine ). You may take 0.5 - 1 tablet (5-10mg ) before bed as needed for muscle pain. This medication can be sedating. Do not drive or operate heavy machinery after taking this medicine. Do not drink alcohol or take other sedating medications when taking this medicine for safety reasons.  Keep this out of reach of small children.    You have been prescribed prednisone  which is an anti-inflammatory. Please take this medication as prescribed for the next 7 days (40mg  on days 1 and 2, 30mg  on days 3 and 4, 20mg  on days 5 and 6, 10mg  on day 7).  You were given your first dose here today.  Take this medication in the morning with breakfast, as taking it at night may make it hard to sleep.   You have been prescribed lidocaine  patches to use on your back.  You are given your first patch here today.  May leave the patch on for up to 12 hours at a time, then must remove the patch for full 12 hours before reapplying a new patch.   Please contact your PCP if your back pain does not start to improve over the next 2 weeks as you may benefit from a PT referral from your PCP.   Return to the ER if you develop loss of bowel or bladder control, you have severe worsening of your back pain, unexplained fevers, numbness in your legs, any other new or concerning symptoms

## 2024-03-24 ENCOUNTER — Emergency Department (HOSPITAL_BASED_OUTPATIENT_CLINIC_OR_DEPARTMENT_OTHER)
Admission: EM | Admit: 2024-03-24 | Discharge: 2024-03-24 | Disposition: A | Discharge status: Home / Self Care | Attending: Emergency Medicine | Admitting: Emergency Medicine

## 2024-03-24 ENCOUNTER — Other Ambulatory Visit: Payer: Self-pay

## 2024-03-24 DIAGNOSIS — S61216A Laceration without foreign body of right little finger without damage to nail, initial encounter: Secondary | ICD-10-CM | POA: Diagnosis present

## 2024-03-24 DIAGNOSIS — W260XXA Contact with knife, initial encounter: Secondary | ICD-10-CM | POA: Diagnosis not present

## 2024-03-24 MED ORDER — LIDOCAINE HCL 2 % IJ SOLN
10.0000 mL | Freq: Once | INTRAMUSCULAR | Status: AC
  Administered 2024-03-24: 200 mg
  Filled 2024-03-24: qty 20

## 2024-03-24 NOTE — ED Provider Notes (Signed)
 "  [place] [facility]  Provider Note  CSN: [id number] Arrival date & time: 03/24/24 0024  History Chief Complaint  Patient presents with   Laceration    Jeremy Whitaker is a 38 y.o. male arrives by EMS with laceration to R little finger with a kitchen knife. TDAP is UTD.    Home Medications Prior to Admission medications  Medication Sig Start Date End Date Taking? Authorizing Provider  lidocaine  (LIDODERM ) 5 % Place 1 patch onto the skin daily. Apply 1 patch for up to 12 hours.  Then remove the patch for full 12 hours before reapplying a new patch 10/25/23   Veta Palma, PA-C  ondansetron  (ZOFRAN -ODT) 4 MG disintegrating tablet Take 1 tablet (4 mg total) by mouth every 8 (eight) hours as needed for nausea or vomiting. 03/31/21   Theadore Ozell HERO, MD     Allergies    Codeine and Penicillins   Review of Systems   Review of Systems Please see HPI for pertinent positives and negatives  Physical Exam BP 129/84 (BP Location: Left Arm)   Pulse (!) 107   Temp (!) 97.1 F (36.2 C) (Oral)   Resp 18   Ht 5' 11 (1.803 m)   Wt 81.6 kg   SpO2 94%   BMI 25.10 kg/m   Physical Exam Vitals and nursing note reviewed.  HENT:     Head: Normocephalic.     Nose: Nose normal.  Eyes:     Extraocular Movements: Extraocular movements intact.  Pulmonary:     Effort: Pulmonary effort is normal.  Musculoskeletal:        General: Normal range of motion.     Cervical back: Neck supple.     Comments: 2.6 Laceration to palmar R little finger over the DIP joint, able to flex finger but some weakness to resistance, no definite tendon laceration on wound bed examination  Skin:    Findings: No rash (on exposed skin).  Neurological:     Mental Status: He is alert and oriented to person, place, and time.  Psychiatric:        Mood and Affect: Mood normal.     ED Results / Procedures / Treatments   EKG None  Procedures .Laceration  Repair  Date/Time: 03/24/2024 2:14 AM  Performed by: Roselyn Carlin NOVAK, MD Authorized by: Roselyn Carlin NOVAK, MD   Consent:    Consent obtained:  Verbal   Consent given by:  Patient Anesthesia:    Anesthesia method:  Nerve block   Block anesthetic:  Lidocaine  2% w/o epi   Block technique:  Ring block   Block outcome:  Anesthesia achieved Laceration details:    Location:  Finger   Finger location:  R small finger   Length (cm):  2.6 Pre-procedure details:    Preparation:  Patient was prepped and draped in usual sterile fashion Exploration:    Hemostasis achieved with:  Tourniquet and direct pressure Treatment:    Area cleansed with:  Povidone-iodine   Irrigation solution:  Sterile saline   Irrigation method:  Syringe Skin repair:    Repair method:  Sutures   Suture size:  5-0   Suture material:  Nylon   Suture technique:  Simple interrupted   Number of sutures:  5 Approximation:    Approximation:  Close Repair type:    Repair type:  Simple Post-procedure details:    Dressing:  Non-adherent dressing   Procedure completion:  Tolerated well, no immediate complications  Medications Ordered in the ED Medications  lidocaine  (XYLOCAINE ) 2 % (with pres) injection 200 mg (200 mg Infiltration Given by Other 03/24/24 9787)    Initial Impression and Plan  Patient here with finger laceration, repaired as above. Given concern for possible tendon injury, will send to [facility] for follow up. Wound care instructions given. TDAP is UTD. Suture removal in about a week.   ED Course       MDM Rules/Calculators/A&P Medical Decision Making Problems Addressed: Laceration of right little finger without foreign body without damage to nail, initial encounter: acute illness or injury  Risk Prescription drug management.     Final Clinical Impression(s) / ED Diagnoses Final diagnoses:  Laceration of right little finger without foreign body without damage to nail, initial encounter     Rx / DC Orders ED Discharge Orders     None        Roselyn Carlin NOVAK, MD 03/24/24 0217  "

## 2024-03-24 NOTE — ED Notes (Signed)
 Pt soaking finger in solution of n/s and iodine

## 2024-03-24 NOTE — ED Notes (Signed)
 Suture cart at bedside

## 2024-03-24 NOTE — ED Triage Notes (Signed)
 Patient brought in by EMS with c/o cutting his right pinky while cooking. Patient alert and oriented x4, ETOH. Bleeding controlled.
